# Patient Record
Sex: Female | Born: 1950 | Race: White | Hispanic: No | Marital: Married | Smoking: Former smoker
Health system: Southern US, Community
[De-identification: ages and names within clinical notes are randomized; demographics above are authoritative.]

---

## 2017-01-05 ENCOUNTER — Other Ambulatory Visit: Payer: Self-pay | Admitting: Physical Medicine and Rehabilitation

## 2017-01-05 DIAGNOSIS — M5416 Radiculopathy, lumbar region: Secondary | ICD-10-CM

## 2017-01-06 ENCOUNTER — Ambulatory Visit
Admission: RE | Admit: 2017-01-06 | Discharge: 2017-01-06 | Disposition: A | Discharge status: Home / Self Care | Payer: Medicare Other | Source: Ambulatory Visit | Attending: Physical Medicine and Rehabilitation | Admitting: Physical Medicine and Rehabilitation

## 2017-01-06 DIAGNOSIS — M5416 Radiculopathy, lumbar region: Secondary | ICD-10-CM

## 2019-02-23 ENCOUNTER — Ambulatory Visit

## 2019-03-01 ENCOUNTER — Ambulatory Visit: Payer: Medicare Other

## 2019-03-06 ENCOUNTER — Ambulatory Visit

## 2020-08-03 ENCOUNTER — Other Ambulatory Visit: Payer: Self-pay | Admitting: Physical Medicine and Rehabilitation

## 2020-08-03 DIAGNOSIS — R200 Anesthesia of skin: Secondary | ICD-10-CM

## 2020-08-03 DIAGNOSIS — M545 Low back pain, unspecified: Secondary | ICD-10-CM

## 2020-08-15 ENCOUNTER — Other Ambulatory Visit: Payer: Self-pay

## 2020-08-15 ENCOUNTER — Ambulatory Visit
Admission: RE | Admit: 2020-08-15 | Discharge: 2020-08-15 | Disposition: A | Discharge status: Home / Self Care | Source: Ambulatory Visit | Attending: Physical Medicine and Rehabilitation | Admitting: Physical Medicine and Rehabilitation

## 2020-08-15 DIAGNOSIS — M545 Low back pain, unspecified: Secondary | ICD-10-CM

## 2020-08-15 DIAGNOSIS — R200 Anesthesia of skin: Secondary | ICD-10-CM

## 2020-08-15 MED ORDER — GADOBENATE DIMEGLUMINE 529 MG/ML IV SOLN
10.0000 mL | Freq: Once | INTRAVENOUS | Status: AC | PRN
  Administered 2020-08-15: 10 mL via INTRAVENOUS

## 2022-06-07 ENCOUNTER — Ambulatory Visit: Payer: Medicare PPO | Admitting: Physical Therapy

## 2022-06-13 ENCOUNTER — Ambulatory Visit: Payer: Medicare PPO

## 2022-06-15 ENCOUNTER — Encounter: Admitting: Physical Therapy

## 2022-06-20 ENCOUNTER — Encounter: Admitting: Physical Therapy

## 2022-06-22 ENCOUNTER — Encounter: Admitting: Physical Therapy

## 2022-06-26 ENCOUNTER — Encounter

## 2022-06-28 ENCOUNTER — Encounter: Admitting: Physical Therapy

## 2022-07-03 ENCOUNTER — Encounter

## 2022-07-05 ENCOUNTER — Encounter: Admitting: Physical Therapy

## 2022-07-11 ENCOUNTER — Ambulatory Visit: Payer: Medicare PPO

## 2022-07-13 ENCOUNTER — Ambulatory Visit: Payer: Medicare PPO | Attending: Physical Medicine and Rehabilitation | Admitting: Physical Therapy

## 2022-07-13 ENCOUNTER — Encounter: Payer: Self-pay | Admitting: Physical Therapy

## 2022-07-13 DIAGNOSIS — M5459 Other low back pain: Secondary | ICD-10-CM | POA: Diagnosis present

## 2022-07-13 DIAGNOSIS — M6281 Muscle weakness (generalized): Secondary | ICD-10-CM | POA: Insufficient documentation

## 2022-07-13 NOTE — Therapy (Signed)
OUTPATIENT PHYSICAL THERAPY THORACOLUMBAR EVALUATION   Patient Name: Lindsey Waters MRN: [id number] DOB:21-Jul-1950, 72 y.o., female Today's Date: 07/13/2022  END OF SESSION:  PT End of Session - 07/13/22 1728     Visit Number 1    Number of Visits 12    Date for PT Re-Evaluation 08/24/22    Authorization Type [employer] M[employer] + [facility]    Progress Note Due on Visit 10    PT Start Time 1620    PT Stop Time 1700    PT Time Calculation (min) 40 min    Activity Tolerance Patient tolerated treatment well    Behavior During Therapy [facility] for tasks assessed/performed             History reviewed. No pertinent past medical history. History reviewed. No pertinent surgical history. There are no problems to display for this patient.   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   Rationale for Evaluation and Treatment: Rehabilitation  THERAPY DIAG:  Other low back pain  Muscle weakness (generalized)  ONSET DATE: chronic pain since 2018  SUBJECTIVE:                                                                                                                                                                                           SUBJECTIVE STATEMENT: I had a ruptured disk in 2018 it was really bad, went to [facility], it was really bad for several months before I had surgery, the surgery was successful, but with my age and arthritis, I've been getting epidurals in my spine for last 3 years which help for a while, have talked about deadening nerves, but I should have gotten into PT a long time ago, but was unable due to care giving for husband.  Pain medication helps but just get used to pain being there.  Pain is always there from the time I put my feet on the floor to the end of the day.   PERTINENT HISTORY:  History chronic LBP and  previous back surgery (lumbar diskectomy L5-S1 2019), Hepatitis C (cured), osteoporosis, L shoulder RTR, L chronic L shoulder pain.   PAIN:  Are you having pain? Yes: NPRS scale: 6/10 Pain location: across low back radiating down both posterior thighs Pain description: constant dull ache Aggravating factors: increases with activity, standing for long periods with cooking Relieving factors: ice, laying down and resting  PRECAUTIONS: None  WEIGHT BEARING RESTRICTIONS: No  FALLS:  Has patient fallen in last 6 months?  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 At L5-S1, significantly decreased size of the previously seen right subarticular disc protrusion. Small amount of residual disc in the right subarticular recess which contacts the descending right S1 nerve roots without clear impingement. Mild bilateral foraminal stenosis, slightly progressed. 2. At L4-L5, progression of now mild canal and bilateral subarticular recess stenosis with similar mild to moderate right and mild left foraminal stenosis. 3. At L3-L4, similar borderline bilateral subarticular recess stenosis and mild bilateral foraminal stenosis. 4. Sludge with 2 cm stone in the gallbladder with similar findings on the prior. The common bile duct is mildly dilated (approximately 7 mm) which may be in part chronic given some dilation on the  prior. There is suggestion of possible filling defect within the distal common bile duct (series 5, image 17), although evaluation is limited on this study. Correlation with liver enzymes may be helpful. If there is clinical concern for obstruction then MRCP could further evaluate.  PATIENT SURVEYS:  Modified Oswestry 18/50= 36%   SCREENING FOR RED FLAGS: Bowel or bladder incontinence: No Spinal tumors: No Cauda equina syndrome: No Compression fracture: No Abdominal aneurysm: No  COGNITION: Overall cognitive status: Within functional limits for tasks assessed     SENSATION: WFL Tingling in bil anterior thighs  MUSCLE LENGTH: Hamstrings: Right ~75 deg moderate tightness; Left 90 deg Quadriceps - moderate tightness bil  POSTURE: decreased lumbar lordosis  PALPATION: Diffuse tenderness throughout bil lumbar paraspinals, lumbar spine with PA mobs, bil SIJ, bil QL, bil glute med.  Noted muscle atrophy in R piriformis.   LUMBAR ROM:   AROM eval  Flexion To ankles, mod pain  Extension WNL, dec pain  Right lateral flexion To knee  Left lateral flexion To knee, inc pain  Right rotation WNL, inc pain  Left rotation WNL, inc pain   (Blank rows = not tested)  LOWER EXTREMITY ROM:    L hip tight with ER, good IR, pinch with FABER, 90 SLR, min HS tightness, on R HS tighter, hip scour pos, Faber neg (felt like good stretch), hip ROM = to L.    LOWER EXTREMITY MMT:    MMT Right eval Left eval  Hip flexion 4+ 4+  Hip extension 3+ 4  Hip abduction 5 5  Hip adduction 4+ 4+  Knee flexion 5 5  Knee extension 5 5  Ankle dorsiflexion 5 5   (Blank rows = not tested)  LUMBAR SPECIAL TESTS:  Straight leg raise test: Negative and FABER test: reports pinching in back on L.  Positive scour test on R.   GAIT: Comments: WNL, no deviation or device  TODAY'S TREATMENT:  DATE:   Therapeutic Exercise: to improve strength and mobility.  Demo, verbal and tactile cues throughout for technique. Prone knee bends x 10 - hamstring cramping on L Hamstring stretches - seated - cues needed for form   PATIENT EDUCATION:  Education details: findings, POC, initial HEP Person educated: Patient Education method: Explanation, Demonstration, Verbal cues, and Handouts Education comprehension: verbalized understanding and returned demonstration  HOME EXERCISE PROGRAM: Access Code: [email or web address] URL: [email or web address] Date: 07/13/2022 Prepared by: Harrie Foreman  Exercises - Prone Knee Flexion  - 1 x daily - 7 x weekly - 2 sets - 10 reps - Seated Hamstring Stretch  - 1 x daily - 7 x weekly - 1 sets - 3 reps - 30 sec to 1 min  hold - Supine Hamstring Stretch with Strap  - 1 x daily - 7 x weekly - 1 sets - 3 reps - 30 sec to 1 min  hold  ASSESSMENT:  CLINICAL IMPRESSION: Patient is a 72 y.o. female who was seen today for physical therapy evaluation and treatment for chronic low back pain.  She reports history of back pain after disc herniation and surgery in 2018, and feeling that muscles in back are not strong enough to support her.  She has a preference for lumbar extension.  Noted today diffuse tenderness throughout lumbar region, weakness in lumbar and hip extensors, tightness in R hamstring, and increased neural tension with prone knee bend.  Diannia Ruder would benefit from skilled physical therapy to decrease low back pain, improve core and hip strength, and improve tolerance to activities to be able to perform ADLS with less discomfort and improve quality of life.     OBJECTIVE IMPAIRMENTS: decreased activity tolerance, decreased ROM, decreased strength, increased muscle spasms, impaired sensation, and pain.   ACTIVITY LIMITATIONS: carrying, lifting, bending, standing, squatting, sleeping, dressing, locomotion level, and caring for  others  PARTICIPATION LIMITATIONS: meal prep, cleaning, laundry, driving, shopping, community activity, and occupation  PERSONAL FACTORS: Age, Time since onset of injury/illness/exacerbation, and 1-2 comorbidities: History chronic LBP and previous back surgery (lumbar diskectomy L5-S1 2019),  osteoporosis, L shoulder RTR, L chronic L shoulder pain   are also affecting patient's functional outcome.   REHAB POTENTIAL: Good  CLINICAL DECISION MAKING: Evolving/moderate complexity  EVALUATION COMPLEXITY: Moderate  GOALS: Goals reviewed with patient? Yes  SHORT TERM GOALS: Target date: 07/27/2022   Patient will be independent with initial HEP.  Baseline:  Goal status: INITIAL  LONG TERM GOALS: Target date: 08/24/2022    Patient will be independent with advanced/ongoing HEP to improve outcomes and carryover.  Baseline:  Goal status: INITIAL  2.  Patient will report 50% improvement in low back pain to improve QOL.  Baseline:  Goal status: INITIAL  3.  Patient will demonstrate improved functional strength as demonstrated by 5/5 hip strength. Baseline: see objective Goal status: INITIAL  4.  Patient will report at least 6 points improvement on modified Oswestry to demonstrate improved functional ability.  Baseline: 18/50 Goal status: INITIAL   5.  Patient will tolerate standing for an hour without increased low back pain in order to cook.   Baseline: has to stop, lay down, ice back Goal status: INITIAL  PLAN:  PT FREQUENCY: 1-2x/week  PT DURATION: 6 weeks  PLANNED INTERVENTIONS: Therapeutic exercises, Therapeutic activity, Neuromuscular re-education, Balance training, Gait training, Patient/Family education, Self Care, Joint mobilization, Joint manipulation, Dry Needling, Electrical stimulation, Spinal manipulation, Spinal mobilization, Cryotherapy, Moist heat, Taping, Traction, Ultrasound, Manual therapy, and Re-evaluation.  PLAN  FOR NEXT SESSION: progress core/hip extensor  strengthening, discuss TrDN to multifidi with Estim    Jena Gauss, PT, DPT 07/13/2022, 5:51 PM

## 2022-07-17 ENCOUNTER — Ambulatory Visit: Payer: Medicare PPO | Admitting: Physical Therapy

## 2022-07-18 ENCOUNTER — Ambulatory Visit: Payer: Medicare PPO

## 2022-07-20 ENCOUNTER — Encounter

## 2022-07-25 ENCOUNTER — Encounter

## 2022-08-01 ENCOUNTER — Encounter

## 2022-08-03 ENCOUNTER — Ambulatory Visit: Payer: Medicare PPO

## 2022-08-08 ENCOUNTER — Encounter

## 2022-08-10 ENCOUNTER — Ambulatory Visit: Payer: Medicare PPO | Attending: Physical Medicine and Rehabilitation

## 2022-08-10 DIAGNOSIS — M6281 Muscle weakness (generalized): Secondary | ICD-10-CM | POA: Diagnosis present

## 2022-08-10 DIAGNOSIS — M5459 Other low back pain: Secondary | ICD-10-CM | POA: Insufficient documentation

## 2022-08-10 NOTE — Therapy (Signed)
OUTPATIENT PHYSICAL THERAPY THORACOLUMBAR EVALUATION   Patient Name: Lindsey Waters MRN: [id number] DOB:1950/05/23, 72 y.o., female Today's Date: 08/10/2022  END OF SESSION:  PT End of Session - 08/10/22 1431     Visit Number 2    Number of Visits 12    Date for PT Re-Evaluation 08/24/22    Authorization Type [employer] [facility]    Progress Note Due on Visit 10    PT Start Time 1404    PT Stop Time 1448    PT Time Calculation (min) 44 min    Activity Tolerance Patient tolerated treatment well    Behavior During Therapy WFL for tasks assessed/performed              History reviewed. No pertinent past medical history. History reviewed. No pertinent surgical history. There are no problems to display for this patient.   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   PERTINENT HISTORY:  History chronic LBP and previous back surgery (lumbar diskectomy L5-S1 2019), Hepatitis C (cured), osteoporosis, L shoulder RTR, L chronic L shoulder pain.   PAIN:  Are you having pain? Yes: NPRS scale: 6/10 Pain location: across low back radiating down both posterior thighs Pain description: constant dull ache Aggravating factors: increases with activity, standing for long periods with cooking Relieving factors: ice, laying down and resting  PRECAUTIONS: None  WEIGHT BEARING  RESTRICTIONS: No  FALLS:  Has patient fallen in last 6 months? 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 At L5-S1, significantly decreased size of the previously seen right subarticular disc protrusion. Small amount of residual disc in the right subarticular recess which contacts the descending right S1 nerve roots without clear impingement. Mild bilateral foraminal stenosis, slightly progressed. 2. At L4-L5, progression of now mild canal and bilateral subarticular recess stenosis with similar mild to moderate right and mild left foraminal stenosis. 3. At L3-L4, similar borderline bilateral subarticular recess stenosis and mild bilateral foraminal stenosis. 4. Sludge with 2 cm stone in the gallbladder with similar findings on the prior. The common bile duct is mildly dilated (approximately 7 mm) which may be in part chronic given some dilation on the prior. There is suggestion of possible filling defect within the distal common bile duct (series 5, image 17), although evaluation is limited on this study. Correlation with liver enzymes may be helpful. If there is clinical concern for obstruction then MRCP could further evaluate.  PATIENT SURVEYS:  Modified Oswestry 18/50= 36%   SCREENING FOR RED FLAGS: Bowel or bladder incontinence: No Spinal tumors: No Cauda equina syndrome: No Compression  fracture: No Abdominal aneurysm: No  COGNITION: Overall cognitive status: Within functional limits for tasks assessed     SENSATION: WFL Tingling in bil anterior thighs  MUSCLE LENGTH: Hamstrings: Right ~75 deg moderate tightness; Left 90 deg Quadriceps - moderate tightness bil  POSTURE: decreased lumbar lordosis  PALPATION: Diffuse tenderness throughout bil lumbar paraspinals, lumbar spine with PA mobs, bil SIJ, bil QL, bil glute med.  Noted muscle atrophy in R piriformis.   LUMBAR ROM:   AROM eval  Flexion To ankles, mod pain  Extension WNL, dec pain  Right lateral flexion To knee  Left lateral flexion To knee, inc pain  Right rotation WNL, inc pain  Left rotation WNL, inc pain   (Blank rows = not tested)  LOWER EXTREMITY ROM:    L hip tight with ER, good IR, pinch with FABER, 90 SLR, min HS tightness, on R HS tighter, hip scour pos, Faber neg (felt like good stretch), hip ROM = to L.    LOWER EXTREMITY MMT:    MMT Right eval Left eval  Hip flexion 4+ 4+  Hip extension 3+ 4  Hip abduction 5 5  Hip adduction 4+ 4+  Knee flexion 5 5  Knee extension 5 5  Ankle dorsiflexion 5 5   (Blank rows = not tested)  LUMBAR SPECIAL TESTS:  Straight leg raise test: Negative and FABER test: reports pinching in back on L.  Positive scour test on R.   GAIT: Comments: WNL, no deviation or device  TODAY'S TREATMENT:                                                                                                                              DATE:  08/10/22 Supine PPT 2x10 Supine heel slide with TrA x 10  Supine heel press into table  leg extended x 10  Supine SKTC stretch 3x10" Supine LTR x 10 bil Seated hamstring stretch 3x10" Seated LAQ x 5 bil  Therapeutic Exercise: to improve strength and mobility.  Demo, verbal and tactile cues throughout for technique. Prone knee bends x 10 - hamstring cramping on L Hamstring stretches - seated - cues needed for form   PATIENT  EDUCATION:  Education details: HEP update Person educated: Patient Education method: Explanation, Demonstration, Verbal cues, and Handouts Education comprehension: verbalized understanding and returned demonstration  HOME EXERCISE PROGRAM: Access Code: [email or web address] URL: [email or web address] Date: 08/10/2022 Prepared by: Verta Ellen  Exercises - Seated Hamstring Stretch  - 1 x daily - 7 x weekly - 1 sets - 3 reps - 30 sec to 1 min  hold - Supine Hamstring Stretch with Strap  - 1 x daily - 7 x weekly - 1 sets - 3 reps - 30 sec to 1 min  hold - Supine Posterior Pelvic Tilt  - 1 x daily - 7 x weekly - 3 sets - 10 reps - Supine Single Knee to Chest Stretch  - 1 x daily - 7 x weekly - 3 sets - 3 reps - 10 sec hold - Supine Transversus Abdominis Bracing with Heel Slide  - 1 x daily - 7 x weekly - 2 sets - 10 reps  ASSESSMENT:  CLINICAL IMPRESSION: Able to progress exercises to tolerance. Did very light exercises to address pain and improve lumbo-pelvic mobility. Did need cues to avoid over engaging muscles with pelvic tilts and heel presses. Pt has missed therapy for a few weeks, noting increased pain from initial eval, no improvements yet d/t not returning since eval. Diannia Ruder would benefit from skilled physical therapy to decrease low back pain, improve core and hip strength, and improve tolerance to activities to be able to perform ADLS with less discomfort and improve quality of life.     OBJECTIVE IMPAIRMENTS: decreased activity tolerance, decreased ROM, decreased strength, increased muscle spasms, impaired sensation, and pain.   ACTIVITY LIMITATIONS: carrying, lifting, bending, standing, squatting, sleeping, dressing, locomotion level, and caring for others  PARTICIPATION LIMITATIONS: meal prep, cleaning, laundry, driving, shopping, community activity, and occupation  PERSONAL FACTORS: Age, Time since onset of injury/illness/exacerbation, and 1-2 comorbidities: History  chronic LBP and previous back surgery (lumbar diskectomy L5-S1 2019),  osteoporosis, L shoulder RTR, L chronic L shoulder pain   are also affecting patient's functional outcome.   REHAB POTENTIAL: Good  CLINICAL DECISION MAKING: Evolving/moderate complexity  EVALUATION COMPLEXITY: Moderate  GOALS: Goals reviewed with patient? Yes  SHORT TERM GOALS: Target date: 07/27/2022   Patient will be independent with initial HEP.  Baseline:  Goal status: INITIAL  LONG TERM GOALS: Target date: 08/24/2022    Patient will be independent with advanced/ongoing HEP to improve outcomes and carryover.  Baseline:  Goal status: INITIAL  2.  Patient will report 50% improvement in low back pain to improve QOL.  Baseline:  Goal status: IN PROGRESS-   3.  Patient will demonstrate improved functional strength as demonstrated by 5/5 hip strength. Baseline: see objective Goal status: INITIAL  4.  Patient will report at least 6 points improvement on modified Oswestry to demonstrate improved functional ability.  Baseline: 18/50 Goal status: INITIAL   5.  Patient will tolerate standing for an hour without increased low back pain in order to cook.   Baseline: has to stop, lay down, ice back Goal status: INITIAL  PLAN:  PT FREQUENCY: 1-2x/week  PT DURATION: 6  weeks  PLANNED INTERVENTIONS: Therapeutic exercises, Therapeutic activity, Neuromuscular re-education, Balance training, Gait training, Patient/Family education, Self Care, Joint mobilization, Joint manipulation, Dry Needling, Electrical stimulation, Spinal manipulation, Spinal mobilization, Cryotherapy, Moist heat, Taping, Traction, Ultrasound, Manual therapy, and Re-evaluation.  PLAN FOR NEXT SESSION: gentle progression of core/hip extensor strengthening, discuss TrDN to multifidi with Estim    Darleene Cleaver, PTA 08/10/2022, 4:04 PM

## 2022-08-15 ENCOUNTER — Ambulatory Visit: Payer: Medicare PPO

## 2022-08-15 ENCOUNTER — Other Ambulatory Visit: Payer: Self-pay

## 2022-08-15 DIAGNOSIS — M5459 Other low back pain: Secondary | ICD-10-CM

## 2022-08-15 DIAGNOSIS — M6281 Muscle weakness (generalized): Secondary | ICD-10-CM

## 2022-08-15 NOTE — Therapy (Signed)
OUTPATIENT PHYSICAL THERAPY THORACOLUMBAR TREATMENT   Patient Name: Lindsey Waters MRN: [id number] DOB:1951-01-07, 72 y.o., female Today's Date: 08/15/2022  END OF SESSION:  PT End of Session - 08/15/22 1412     Visit Number 3    Number of Visits 12    Date for PT Re-Evaluation 08/24/22    Authorization Type [employer] MCR + [facility]    Progress Note Due on Visit 10    PT Start Time 1402    PT Stop Time 1445    PT Time Calculation (min) 43 min    Activity Tolerance Patient tolerated treatment well    Behavior During Therapy WFL for tasks assessed/performed               History reviewed. No pertinent past medical history. History reviewed. No pertinent surgical history. There are no problems to display for this patient.   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   PERTINENT HISTORY:  History chronic LBP and previous back surgery (lumbar diskectomy L5-S1 2019), Hepatitis C (cured), osteoporosis, L shoulder RTR, L chronic L shoulder pain.   PAIN:  Are you having pain? Yes: NPRS scale: 6/10 Pain location: across low back radiating down both posterior thighs Pain description: constant dull ache Aggravating factors: increases with activity, standing for long periods with cooking Relieving factors: ice, laying down and resting  PRECAUTIONS: None  WEIGHT  BEARING RESTRICTIONS: No  FALLS:  Has patient fallen in last 6 months? 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 At L5-S1, significantly decreased size of the previously seen right subarticular disc protrusion. Small amount of residual disc in the right subarticular recess which contacts the descending right S1 nerve roots without clear impingement. Mild bilateral foraminal stenosis, slightly progressed. 2. At L4-L5, progression of now mild canal and bilateral subarticular recess stenosis with similar mild to moderate right and mild left foraminal stenosis. 3. At L3-L4, similar borderline bilateral subarticular recess stenosis and mild bilateral foraminal stenosis. 4. Sludge with 2 cm stone in the gallbladder with similar findings on the prior. The common bile duct is mildly dilated (approximately 7 mm) which may be in part chronic given some dilation on the prior. There is suggestion of possible filling defect within the distal common bile duct (series 5, image 17), although evaluation is limited on this study. Correlation with liver enzymes may be helpful. If there is clinical concern for obstruction then MRCP could further evaluate.  PATIENT SURVEYS:  Modified Oswestry 18/50= 36%   SCREENING FOR RED FLAGS: Bowel or bladder incontinence: No Spinal tumors: No Cauda equina syndrome:  No Compression fracture: No Abdominal aneurysm: No  COGNITION: Overall cognitive status: Within functional limits for tasks assessed     SENSATION: WFL Tingling in bil anterior thighs  MUSCLE LENGTH: Hamstrings: Right ~75 deg moderate tightness; Left 90 deg Quadriceps - moderate tightness bil  POSTURE: decreased lumbar lordosis  PALPATION: Diffuse tenderness throughout bil lumbar paraspinals, lumbar spine with PA mobs, bil SIJ, bil QL, bil glute med.  Noted muscle atrophy in R piriformis.   LUMBAR ROM:   AROM eval  Flexion To ankles, mod pain  Extension WNL, dec pain  Right lateral flexion To knee  Left lateral flexion To knee, inc pain  Right rotation WNL, inc pain  Left rotation WNL, inc pain   (Blank rows = not tested)  LOWER EXTREMITY ROM:    L hip tight with ER, good IR, pinch with FABER, 90 SLR, min HS tightness, on R HS tighter, hip scour pos, Faber neg (felt like good stretch), hip ROM = to L.    LOWER EXTREMITY MMT:    MMT Right eval Left eval  Hip flexion 4+ 4+  Hip extension 3+ 4  Hip abduction 5 5  Hip adduction 4+ 4+  Knee flexion 5 5  Knee extension 5 5  Ankle dorsiflexion 5 5   (Blank rows = not tested)  LUMBAR SPECIAL TESTS:  Straight leg raise test: Negative and FABER test: reports pinching in back on L.  Positive scour test on R.   GAIT: Comments: WNL, no deviation or device  TODAY'S TREATMENT:                                                                                                                              DATE:  08/15/22:Therapeutic exercises:  instructed/ guided the patient in therex as described below with emphasis on  improving upright posture through thoracic spine and ant hips , educated pt throughout on pacing activities and positioning, posture to reduce compressive forces LS spine. All exercises performed to tolerance, 10 to 20 reps each  Supine , one leg over peanut physioball, therapist assisted with very light manual  stretching hip adductors and flexors each leg to tolerance Supine with B legs over peanut physioball for bridging, avoiding excessive torque lumbar region and isolating B gluteals Supine B legs over peanut for isometric B shoulder horizontal abduction combined with B shoulder flexion to engage, recruit scapular retractors Standing B shoulder flexion, wall slides to improve, decompress thoracic spine     08/10/22 Supine PPT 2x10 Supine heel slide with TrA x 10  Supine heel press into table leg extended x 10  Supine SKTC stretch 3x10" Supine LTR x 10 bil Seated hamstring stretch 3x10" Seated LAQ x 5 bil  Therapeutic Exercise: to improve strength and mobility.  Demo, verbal and tactile cues throughout for technique. Prone knee bends x 10 - hamstring cramping on L Hamstring stretches - seated - cues needed for form   PATIENT EDUCATION:  Education details: HEP update Person educated: Patient Education method: Explanation, Demonstration, Verbal cues, and Handouts Education comprehension: verbalized understanding and returned demonstration  HOME EXERCISE PROGRAM: Access Code: [facility] [place] URL: [email or web address] Date: 08/15/2022 Prepared by: Caralee Ates  Exercises - Standing shoulder flexion wall slides  - 1 x daily - 7 x weekly - 3 sets - 10 reps - Supine Bridge with Pelvic Floor Contraction on Swiss Ball  - 1 x daily - 7 x weekly - 3 sets - 10 reps - Supine Shoulder Overhead Flexion with Ball  - 1 x daily - 7 x weekly - 3 sets - 10 reps Access Code: [email or web address] URL: [email or web address] Date: 08/10/2022 Prepared by: Verta Ellen  Exercises - Seated Hamstring Stretch  - 1 x daily - 7 x weekly - 1 sets - 3 reps - 30 sec to 1 min  hold - Supine Hamstring Stretch with Strap  - 1 x daily - 7 x weekly - 1 sets - 3 reps - 30 sec to 1 min  hold - Supine Posterior Pelvic Tilt  - 1 x daily - 7 x weekly - 3 sets - 10 reps - Supine Single Knee to Chest Stretch  -  1 x daily - 7 x weekly - 3 sets - 3 reps - 10 sec hold - Supine Transversus Abdominis Bracing with Heel Slide  - 1 x daily - 7 x weekly - 2 sets - 10 reps  ASSESSMENT:  CLINICAL IMPRESSION: Able to progress exercises to tolerance. Very stiff, labored movements with sit to stand and with bed mobility.  Has protective posture, with flexed B hips and l lateral pelvic shift noted.   Diannia Ruder would benefit from skilled physical therapy to decrease low back pain, improve core and hip strength, and improve tolerance to activities to be able to perform ADLS with less discomfort and improve quality of life.     OBJECTIVE IMPAIRMENTS: decreased activity tolerance, decreased ROM, decreased strength, increased muscle spasms, impaired sensation, and pain.   ACTIVITY LIMITATIONS: carrying, lifting, bending, standing, squatting, sleeping, dressing, locomotion level, and caring for others  PARTICIPATION LIMITATIONS: meal prep, cleaning, laundry, driving, shopping, community activity, and occupation  PERSONAL FACTORS: Age, Time since onset of injury/illness/exacerbation, and 1-2 comorbidities: History chronic LBP and previous back surgery (lumbar diskectomy L5-S1 2019),  osteoporosis, L shoulder RTR, L chronic L shoulder pain  are also affecting patient's functional outcome.   REHAB POTENTIAL: Good  CLINICAL DECISION MAKING: Evolving/moderate complexity  EVALUATION COMPLEXITY: Moderate  GOALS: Goals reviewed with patient? Yes  SHORT TERM GOALS: Target date: 07/27/2022   Patient will be independent with initial HEP.  Baseline:  Goal status: INITIAL  LONG TERM GOALS: Target date: 08/24/2022    Patient will be independent with advanced/ongoing HEP to improve outcomes and carryover.  Baseline:  Goal status: INITIAL  2.  Patient will report 50% improvement in low back pain to improve QOL.  Baseline:  Goal status: IN PROGRESS-   3.  Patient will demonstrate improved functional strength as  demonstrated by 5/5 hip strength. Baseline: see objective Goal status: INITIAL  4.  Patient will report at least 6 points improvement on modified Oswestry to demonstrate improved functional ability.  Baseline: 18/50 Goal status: INITIAL   5.  Patient will tolerate standing for an hour without increased low back pain in order to cook.   Baseline: has to stop, lay down, ice back Goal status: INITIAL  PLAN:  PT FREQUENCY: 1-2x/week  PT DURATION: 6 weeks  PLANNED INTERVENTIONS: Therapeutic exercises, Therapeutic activity, Neuromuscular re-education, Balance training, Gait training, Patient/Family education, Self Care, Joint mobilization, Joint manipulation, Dry Needling, Electrical stimulation, Spinal manipulation, Spinal mobilization, Cryotherapy, Moist heat, Taping, Traction, Ultrasound, Manual therapy, and Re-evaluation.  PLAN FOR NEXT SESSION: gentle progression of core/hip extensor strengthening, discuss TrDN to multifidi with Estim    Orvin Netter L Mataya Kilduff, PT. DPT OCS 08/15/2022, 5:33 PM

## 2022-09-07 ENCOUNTER — Encounter: Admitting: Physical Therapy

## 2022-09-13 ENCOUNTER — Encounter: Payer: Self-pay | Admitting: Physical Therapy

## 2022-09-13 ENCOUNTER — Ambulatory Visit: Payer: Medicare PPO | Attending: Physical Medicine and Rehabilitation | Admitting: Physical Therapy

## 2022-09-13 DIAGNOSIS — M6281 Muscle weakness (generalized): Secondary | ICD-10-CM | POA: Insufficient documentation

## 2022-09-13 DIAGNOSIS — M5459 Other low back pain: Secondary | ICD-10-CM | POA: Diagnosis present

## 2022-09-13 NOTE — Therapy (Signed)
OUTPATIENT PHYSICAL THERAPY THORACOLUMBAR TREATMENT/RECERT  Progress Note Reporting Period 07/13/22 to 09/13/2022  See note below for Objective Data and Assessment of Progress/Goals.      Patient Name: Lindsey Waters MRN: [id number] DOB:[birth year], 72 y.o., female Today's Date: 09/13/2022  END OF SESSION:  PT End of Session - 09/13/22 1321     Visit Number 4    Number of Visits 12    Date for PT Re-Evaluation 10/11/22    Authorization Type [employer] + [facility]    Progress Note Due on Visit 10    PT Start Time 1315    PT Stop Time 1401    PT Time Calculation (min) 46 min    Activity Tolerance Patient tolerated treatment well    Behavior During Therapy WFL for tasks assessed/performed               History reviewed. No pertinent past medical history. History reviewed. No pertinent surgical history. There are no problems to display for this patient.   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  Said MD is considering nerve ablation, but has liver stone, and other gallstones, so needs to get that procedure done first.     PERTINENT HISTORY:  History chronic LBP and previous back surgery (lumbar diskectomy L5-S1 2019), Hepatitis C (cured), osteoporosis, L shoulder RTR, L chronic L shoulder pain.   PAIN:  Are you having pain? Yes: NPRS scale:  6/10 Pain location: across low back radiating down both posterior thighs Pain description: constant dull ache Aggravating factors: increases with activity, standing for long periods with cooking Relieving factors: ice, laying down and resting  PRECAUTIONS: None  WEIGHT BEARING RESTRICTIONS: No  FALLS:  Has patient fallen in last 6 months? 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 At L5-S1, significantly decreased size of the previously seen right subarticular disc protrusion. Small amount of residual disc in the right subarticular recess which contacts the descending right S1 nerve roots without clear impingement. Mild bilateral foraminal stenosis, slightly progressed. 2. At L4-L5, progression of now mild canal and bilateral subarticular recess stenosis with similar mild to moderate right and mild left foraminal stenosis. 3. At L3-L4, similar borderline bilateral subarticular recess stenosis and mild bilateral foraminal stenosis. 4. Sludge with 2 cm stone in the gallbladder with similar findings on the prior. The common bile duct is mildly dilated (approximately 7 mm) which may be in part chronic given some dilation on the prior. There is suggestion of possible filling defect within the distal common bile duct (series 5, image 17), although evaluation is limited on this study. Correlation with  liver enzymes may be helpful. If there is clinical concern for obstruction then MRCP could further evaluate.  PATIENT SURVEYS:  Modified Oswestry 18/50= 36%   SCREENING FOR RED FLAGS: Bowel or bladder incontinence: No Spinal tumors: No Cauda equina syndrome: No Compression fracture: No Abdominal aneurysm: No  COGNITION: Overall cognitive status: Within functional limits for tasks assessed     SENSATION: WFL Tingling in bil anterior thighs  MUSCLE LENGTH: Hamstrings: Right ~75 deg moderate tightness; Left 90 deg Quadriceps - moderate tightness bil  POSTURE: decreased lumbar lordosis  PALPATION: Diffuse tenderness throughout bil lumbar paraspinals, lumbar spine with PA mobs, bil SIJ, bil QL, bil glute med.  Noted muscle atrophy in R piriformis.   LUMBAR ROM:   AROM eval  Flexion To ankles, mod pain  Extension WNL, dec pain  Right lateral flexion To knee  Left lateral flexion To knee, inc pain  Right rotation WNL, inc pain  Left rotation WNL, inc pain   (Blank rows = not tested)  LOWER EXTREMITY ROM:    L hip tight with ER, good IR, pinch with FABER, 90 SLR, min HS tightness, on R HS tighter, hip scour pos, Faber neg (felt like good stretch), hip ROM = to L.    LOWER EXTREMITY MMT:    MMT Right eval Left eval  Hip flexion 4+ 4+  Hip extension 3+ 4  Hip abduction 5 5  Hip adduction 4+ 4+  Knee flexion 5 5  Knee extension 5 5  Ankle dorsiflexion 5 5   (Blank rows = not tested)  LUMBAR SPECIAL TESTS:  Straight leg raise test: Negative and FABER test: reports pinching in back on L.  Positive scour test on R.   GAIT: Comments: WNL, no deviation or device  TODAY'S TREATMENT:  DATE:   09/13/2022 Therapeutic Exercise: to improve strength and mobility.  Demo, verbal and tactile cues throughout for technique. Osteoporosis Exercises a la  [place]: Spinal Decompression with diaphragmatic breathing Shoulder press Head Press Leg lengthener Leg Press Manual Therapy: to decrease muscle spasm and pain and improve mobility STM/TPR to lumbar paraspinals, QL and glutes in S/L.   08/15/22:Therapeutic exercises:  instructed/ guided the patient in therex as described below with emphasis on improving upright posture through thoracic spine and ant hips , educated pt throughout on pacing activities and positioning, posture to reduce compressive forces LS spine. All exercises performed to tolerance, 10 to 20 reps each  Supine , one leg over peanut physioball, therapist assisted with very light manual stretching hip adductors and flexors each leg to tolerance Supine with B legs over peanut physioball for bridging, avoiding excessive torque lumbar region and isolating B gluteals Supine B legs over peanut for isometric B shoulder horizontal abduction combined with B shoulder flexion to engage, recruit scapular retractors Standing B shoulder flexion, wall slides to improve, decompress thoracic spine  08/10/22 Supine PPT 2x10 Supine heel slide with TrA x 10  Supine heel press into table leg extended x 10  Supine SKTC stretch 3x10" Supine LTR x 10 bil Seated hamstring stretch 3x10" Seated LAQ x 5 bil  Therapeutic Exercise: to improve strength and mobility.  Demo, verbal and tactile cues throughout for technique. Prone knee bends x 10 - hamstring cramping on L Hamstring stretches - seated - cues needed for form   PATIENT EDUCATION:  Education details: Handout with osteoporosis exercises Person educated: Patient Education method: Explanation, Demonstration, Verbal cues, and Handouts Education comprehension: verbalized understanding and returned demonstration  HOME EXERCISE PROGRAM: Access Code: [email or web address] URL: [email or web address] Date: 08/10/2022 Prepared by: Verta Ellen  Exercises - Seated Hamstring Stretch  - 1 x  daily - 7 x weekly - 1 sets - 3 reps - 30 sec to 1 min  hold - Supine Hamstring Stretch with Strap  - 1 x daily - 7 x weekly - 1 sets - 3 reps - 30 sec to 1 min  hold - Supine Posterior Pelvic Tilt  - 1 x daily - 7 x weekly - 3 sets - 10 reps - Supine Single Knee to Chest Stretch  - 1 x daily - 7 x weekly - 3 sets - 3 reps - 10 sec hold - Supine Transversus Abdominis Bracing with Heel Slide  - 1 x daily - 7 x weekly - 2 sets - 10 reps  ASSESSMENT:  CLINICAL IMPRESSION: Lindsey Waters returns after almost 1 month absence.  She was very unhappy after last session, feeling the other therapist was too aggressive.  Apologized for experience, and reassured she will be assigned to only this therapist and PTA for future appointments.  Since she has only come for a couple of visits, she has not made any progress, so extending POC for 6 more weeks.  She has been performing initial HEP given by PTA, meeting STG #1.  Today started with gentle exercises designed for osteoporosis a la [place], which she tolerated very well and reported did relieve pain with out aggravating her back, given handout with exercises to do to help relax her back.  Gentle manual therapy to back in sidelying to further relax back.  Lindsey Waters continues to demonstrate potential for improvement and would benefit from continued skilled therapy to address impairments.        OBJECTIVE IMPAIRMENTS: decreased activity tolerance, decreased  ROM, decreased strength, increased muscle spasms, impaired sensation, and pain.   ACTIVITY LIMITATIONS: carrying, lifting, bending, standing, squatting, sleeping, dressing, locomotion level, and caring for others  PARTICIPATION LIMITATIONS: meal prep, cleaning, laundry, driving, shopping, community activity, and occupation  PERSONAL FACTORS: Age, Time since onset of injury/illness/exacerbation, and 1-2 comorbidities: History chronic LBP and previous back surgery (lumbar diskectomy L5-S1 2019),   osteoporosis, L shoulder RTR, L chronic L shoulder pain   are also affecting patient's functional outcome.   REHAB POTENTIAL: Good  CLINICAL DECISION MAKING: Evolving/moderate complexity  EVALUATION COMPLEXITY: Moderate  GOALS: Goals reviewed with patient? Yes  SHORT TERM GOALS: Target date: 07/27/2022   Patient will be independent with initial HEP.  Baseline:  Goal status: MET  09/13/22  LONG TERM GOALS: Target date: 08/24/2022    Patient will be independent with advanced/ongoing HEP to improve outcomes and carryover.  Baseline:  Goal status: IN PROGRESS 09/13/22- updated with osteoporosis exercises  2.  Patient will report 50% improvement in low back pain to improve QOL.  Baseline:  Goal status: IN PROGRESS- 09/13/22- no change  3.  Patient will demonstrate improved functional strength as demonstrated by 5/5 hip strength. Baseline: see objective Goal status: IN PROGRESS  4.  Patient will report at least 6 points improvement on modified Oswestry to demonstrate improved functional ability.  Baseline: 18/50 Goal status: IN PROGRESS   5.  Patient will tolerate standing for an hour without increased low back pain in order to cook.   Baseline: has to stop, lay down, ice back Goal status: IN PROGRESS  PLAN:  PT FREQUENCY: 1-2x/week  PT DURATION: 6 weeks  PLANNED INTERVENTIONS: Therapeutic exercises, Therapeutic activity, Neuromuscular re-education, Balance training, Gait training, Patient/Family education, Self Care, Joint mobilization, Joint manipulation, Dry Needling, Electrical stimulation, Spinal manipulation, Spinal mobilization, Cryotherapy, Moist heat, Taping, Traction, Ultrasound, Manual therapy, and Re-evaluation.  PLAN FOR NEXT SESSION: gentle progression of core/hip extensor strengthening, discuss TrDN to multifidi with Estim    Jena Gauss, PT. DPT  09/13/2022, 3:14 PM

## 2022-09-27 ENCOUNTER — Ambulatory Visit: Payer: Medicare PPO | Admitting: Physical Therapy

## 2022-10-04 ENCOUNTER — Ambulatory Visit: Payer: Medicare PPO

## 2022-10-09 ENCOUNTER — Ambulatory Visit: Payer: Medicare PPO | Attending: Physical Medicine and Rehabilitation | Admitting: Physical Therapy

## 2022-10-09 ENCOUNTER — Encounter: Payer: Self-pay | Admitting: Physical Therapy

## 2022-10-09 DIAGNOSIS — M5459 Other low back pain: Secondary | ICD-10-CM | POA: Diagnosis present

## 2022-10-09 DIAGNOSIS — M6281 Muscle weakness (generalized): Secondary | ICD-10-CM | POA: Insufficient documentation

## 2022-10-09 NOTE — Therapy (Signed)
OUTPATIENT PHYSICAL THERAPY THORACOLUMBAR TREATMENT/RECERT   Patient Name: Lindsey Waters MRN: [id number] DOB:1951-01-01, 72 y.o., female Today's Date: 10/09/2022  END OF SESSION:  PT End of Session - 10/09/22 1453     Visit Number 5    Number of Visits 12    Date for PT Re-Evaluation 10/11/22    Authorization Type [employer] MCR + [facility]    Progress Note Due on Visit 10    PT Start Time 1446    PT Stop Time 1532    PT Time Calculation (min) 46 min    Activity Tolerance Patient tolerated treatment well    Behavior During Therapy WFL for tasks assessed/performed               History reviewed. No pertinent past medical history. History reviewed. No pertinent surgical history. There are no problems to display for this patient.   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    Appt. Oct 14 for bile ducts.   PERTINENT HISTORY:  History chronic LBP and previous back surgery (lumbar diskectomy L5-S1 2019), Hepatitis C (cured), osteoporosis, L shoulder RTR, L chronic L shoulder pain.   PAIN:  Are you having pain? Yes: NPRS scale: 5-6/10 Pain location: across low back radiating down both posterior thighs Pain description: constant dull ache Aggravating factors: increases with activity, standing for  long periods with cooking Relieving factors: ice, laying down and resting  PRECAUTIONS: None  WEIGHT BEARING RESTRICTIONS: No  FALLS:  Has patient fallen in last 6 months? 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 At L5-S1, significantly decreased size of the previously seen right subarticular disc protrusion. Small amount of residual disc in the right subarticular recess which contacts the descending right S1 nerve roots without clear impingement. Mild bilateral foraminal stenosis, slightly progressed. 2. At L4-L5, progression of now mild canal and bilateral subarticular recess stenosis with similar mild to moderate right and mild left foraminal stenosis. 3. At L3-L4, similar borderline bilateral subarticular recess stenosis and mild bilateral foraminal stenosis. 4. Sludge with 2 cm stone in the gallbladder with similar findings on the prior. The common bile duct is mildly dilated (approximately 7 mm) which may be in part chronic given some dilation on the prior. There is suggestion of possible filling defect within the distal common bile duct (series 5, image 17), although evaluation is limited on this study. Correlation with liver enzymes may be helpful. If there is clinical concern for obstruction then MRCP could further evaluate.  PATIENT SURVEYS:  Modified Oswestry 18/50= 36%   SCREENING FOR RED  FLAGS: Bowel or bladder incontinence: No Spinal tumors: No Cauda equina syndrome: No Compression fracture: No Abdominal aneurysm: No  COGNITION: Overall cognitive status: Within functional limits for tasks assessed     SENSATION: WFL Tingling in bil anterior thighs  MUSCLE LENGTH: Hamstrings: Right ~75 deg moderate tightness; Left 90 deg Quadriceps - moderate tightness bil  POSTURE: decreased lumbar lordosis  PALPATION: Diffuse tenderness throughout bil lumbar paraspinals, lumbar spine with PA mobs, bil SIJ, bil QL, bil glute med.  Noted muscle atrophy in R piriformis.   LUMBAR ROM:   AROM eval  Flexion To ankles, mod pain  Extension WNL, dec pain  Right lateral flexion To knee  Left lateral flexion To knee, inc pain  Right rotation WNL, inc pain  Left rotation WNL, inc pain   (Blank rows = not tested)  LOWER EXTREMITY ROM:    L hip tight with ER, good IR, pinch with FABER, 90 SLR, min HS tightness, on R HS tighter, hip scour pos, Faber neg (felt like good stretch), hip ROM = to L.    LOWER EXTREMITY MMT:    MMT Right eval Left eval  Hip flexion 4+ 4+  Hip extension 3+ 4  Hip abduction 5 5  Hip adduction 4+ 4+  Knee flexion 5 5  Knee extension 5 5  Ankle dorsiflexion 5 5   (Blank rows = not tested)  LUMBAR SPECIAL TESTS:  Straight leg raise test: Negative and FABER test: reports pinching in back on L.  Positive scour test on R.   GAIT: Comments: WNL, no deviation or device  TODAY'S TREATMENT:  DATE:   10/09/2022 Therapeutic Exercise: to improve strength and mobility.  Demo, verbal and tactile cues throughout for technique. Bike - discontinued aggravating Knee rocks- aggravating Knee rocks with TrA- slightly better but still aggravating Diaphragmatic breathing with TrA - no pain Bent knee fall outs x 10 alternating Manual Therapy:  to decrease muscle spasm and pain and improve mobility STM/TPR to lumbar paraspinals, QL and glutes in S/L.  IASTM with s/s edge to bil QL  09/13/2022 Therapeutic Exercise: to improve strength and mobility.  Demo, verbal and tactile cues throughout for technique. Osteoporosis Exercises a la Meeks: Spinal Decompression with diaphragmatic breathing Shoulder press Head Press Leg lengthener Leg Press Manual Therapy: to decrease muscle spasm and pain and improve mobility STM/TPR to lumbar paraspinals, QL and glutes in S/L.   08/15/22:Therapeutic exercises:  instructed/ guided the patient in therex as described below with emphasis on improving upright posture through thoracic spine and ant hips , educated pt throughout on pacing activities and positioning, posture to reduce compressive forces LS spine. All exercises performed to tolerance, 10 to 20 reps each  Supine , one leg over peanut physioball, therapist assisted with very light manual stretching hip adductors and flexors each leg to tolerance Supine with B legs over peanut physioball for bridging, avoiding excessive torque lumbar region and isolating B gluteals Supine B legs over peanut for isometric B shoulder horizontal abduction combined with B shoulder flexion to engage, recruit scapular retractors Standing B shoulder flexion, wall slides to improve, decompress thoracic spine    PATIENT EDUCATION:  Education details: HEP update Person educated: Patient Education method: Programmer, multimedia, Facilities manager, Verbal cues, and Handouts Education comprehension: verbalized understanding and returned demonstration  HOME EXERCISE PROGRAM: Access Code: [email or web address] URL: [email or web address] Date: 10/09/2022 Prepared by: Harrie Foreman  Exercises - Supine Diaphragmatic Breathing  - 2 x daily - 7 x weekly - 1 sets - 10 reps - Bent Knee Fallouts  - 2 x daily - 7 x weekly - 1 sets - 10 reps  ASSESSMENT:  CLINICAL IMPRESSION: Lindsey Waters reports significant pain relief with gentle decompression exercises, performing multiple times a day.  Tried to advance exercises today, very sensative to any lumbar movement but was able to tolerate diaphragmatic breathing and bent knee fallouts, so added to HEP, followed by manual therapy again in s/l, to decrease muscle spasm in low back.  Due to missed visits due to medical appts. And upcoming medical medical visits, but recent improvement with therapy, extending her POC for additional 8 weeks.  Lindsey Waters continues to demonstrate potential for improvement and would benefit from continued skilled therapy to address impairments.        OBJECTIVE IMPAIRMENTS: decreased activity tolerance, decreased ROM, decreased strength, increased muscle spasms, impaired sensation, and pain.   ACTIVITY LIMITATIONS: carrying, lifting, bending, standing, squatting, sleeping, dressing, locomotion level, and caring for others  PARTICIPATION LIMITATIONS: meal prep, cleaning, laundry, driving, shopping, community activity, and occupation  PERSONAL FACTORS: Age, Time since onset of injury/illness/exacerbation, and 1-2 comorbidities: History chronic LBP and previous back surgery (lumbar diskectomy L5-S1 2019),  osteoporosis, L shoulder RTR, L chronic L shoulder pain   are also affecting patient's functional outcome.   REHAB POTENTIAL: Good  CLINICAL DECISION MAKING: Evolving/moderate complexity  EVALUATION COMPLEXITY: Moderate  GOALS: Goals reviewed with patient? Yes  SHORT TERM GOALS: Target date: 07/27/2022   Patient will be independent with initial HEP.  Baseline:  Goal status: MET  09/13/22  LONG TERM GOALS: Target date: 12/04/2022  Patient will be independent with advanced/ongoing HEP to improve outcomes and carryover.  Baseline:  Goal status: IN PROGRESS 09/13/22- updated with osteoporosis exercises  2.  Patient will report 50% improvement in low back pain to improve QOL.  Baseline:  Goal  status: IN PROGRESS- 09/13/22- no change  3.  Patient will demonstrate improved functional strength as demonstrated by 5/5 hip strength. Baseline: see objective Goal status: IN PROGRESS  4.  Patient will report at least 6 points improvement on modified Oswestry to demonstrate improved functional ability.  Baseline: 18/50 Goal status: IN PROGRESS   5.  Patient will tolerate standing for an hour without increased low back pain in order to cook.   Baseline: has to stop, lay down, ice back Goal status: IN PROGRESS  PLAN:  PT FREQUENCY: 1-2x/week  PT DURATION: 8 weeks  PLANNED INTERVENTIONS: Therapeutic exercises, Therapeutic activity, Neuromuscular re-education, Balance training, Gait training, Patient/Family education, Self Care, Joint mobilization, Joint manipulation, Dry Needling, Electrical stimulation, Spinal manipulation, Spinal mobilization, Cryotherapy, Moist heat, Taping, Traction, Ultrasound, Manual therapy, and Re-evaluation.  PLAN FOR NEXT SESSION: gentle progression of core/hip extensor strengthening, discuss TrDN to multifidi with Estim    Jena Gauss, PT, DPT  10/09/2022, 4:24 PM

## 2022-10-23 ENCOUNTER — Ambulatory Visit: Payer: Medicare PPO

## 2022-11-01 ENCOUNTER — Ambulatory Visit: Payer: Medicare PPO | Attending: Physical Medicine and Rehabilitation | Admitting: Physical Therapy

## 2022-11-01 ENCOUNTER — Encounter: Payer: Self-pay | Admitting: Physical Therapy

## 2022-11-01 DIAGNOSIS — M5459 Other low back pain: Secondary | ICD-10-CM | POA: Insufficient documentation

## 2022-11-01 DIAGNOSIS — M6281 Muscle weakness (generalized): Secondary | ICD-10-CM | POA: Diagnosis present

## 2022-11-01 NOTE — Patient Instructions (Signed)

## 2022-11-01 NOTE — Therapy (Signed)
OUTPATIENT PHYSICAL THERAPY THORACOLUMBAR TREATMENT   Patient Name: Lindsey Waters MRN: [id number] DOB:[birth year], 72 y.o., female Today's Date: 11/01/2022  END OF SESSION:  PT End of Session - 11/01/22 1320     Visit Number 6    Number of Visits 12    Date for PT Re-Evaluation 12/04/22    Authorization Type [employer] M[employer] + [employer]    Authorization Time Period 8 additional approved    Progress Note Due on Visit 10    PT Start Time 1318    PT Stop Time 1400    PT Time Calculation (min) 42 min    Activity Tolerance Patient tolerated treatment well    Behavior During Therapy WFL for tasks assessed/performed               History reviewed. No pertinent past medical history. History reviewed. No pertinent surgical history. There are no problems to display for this patient.   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  She does them frequently throughout the day and just helps her relax and manage her pain.  Sees [facility]. Next Tuesday about ECRP.     PERTINENT HISTORY:  History chronic LBP and previous back surgery (lumbar diskectomy L5-S1 2019), Hepatitis C (cured), osteoporosis, L shoulder RTR, L chronic L shoulder pain.   PAIN:  Are you having pain? Yes: NPRS scale: 6/10 Pain location: across low back radiating down both posterior thighs Pain description: constant dull ache Aggravating factors:  increases with activity, standing for long periods with cooking Relieving factors: ice, laying down and resting  PRECAUTIONS: None  WEIGHT BEARING RESTRICTIONS: No  FALLS:  Has patient fallen in last 6 months? 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 At L5-S1, significantly decreased size of the previously seen right subarticular disc protrusion. Small amount of residual disc in the right subarticular recess which contacts the descending right S1 nerve roots without clear impingement. Mild bilateral foraminal stenosis, slightly progressed. 2. At L4-L5, progression of now mild canal and bilateral subarticular recess stenosis with similar mild to moderate right and mild left foraminal stenosis. 3. At L3-L4, similar borderline bilateral subarticular recess stenosis and mild bilateral foraminal stenosis. 4. Sludge with 2 cm stone in the gallbladder with similar findings on the prior. The common bile duct is mildly dilated (approximately 7 mm) which may be in part chronic given some dilation on the prior. There is suggestion of possible filling defect within the distal common bile duct (series 5, image 17), although evaluation is limited on this study. Correlation with liver enzymes may be helpful. If there is clinical concern for obstruction then MRCP could further evaluate.  PATIENT SURVEYS:  Modified  Oswestry 18/50= 36%   SCREENING FOR RED FLAGS: Bowel or bladder incontinence: No Spinal tumors: No Cauda equina syndrome: No Compression fracture: No Abdominal aneurysm: No  COGNITION: Overall cognitive status: Within functional limits for tasks assessed     SENSATION: WFL Tingling in bil anterior thighs  MUSCLE LENGTH: Hamstrings: Right ~75 deg moderate tightness; Left 90 deg Quadriceps - moderate tightness bil  POSTURE: decreased lumbar lordosis  PALPATION: Diffuse tenderness throughout bil lumbar paraspinals, lumbar spine with PA mobs, bil SIJ, bil QL, bil glute med.  Noted muscle atrophy in R piriformis.   LUMBAR ROM:   AROM eval  Flexion To ankles, mod pain  Extension WNL, dec pain  Right lateral flexion To knee  Left lateral flexion To knee, inc pain  Right rotation WNL, inc pain  Left rotation WNL, inc pain   (Blank rows = not tested)  LOWER EXTREMITY ROM:    L hip tight with ER, good IR, pinch with FABER, 90 SLR, min HS tightness, on R HS tighter, hip scour pos, Faber neg (felt like good stretch), hip ROM = to L.    LOWER EXTREMITY MMT:    MMT Right eval Left eval  Hip flexion 4+ 4+  Hip extension 3+ 4  Hip abduction 5 5  Hip adduction 4+ 4+  Knee flexion 5 5  Knee extension 5 5  Ankle dorsiflexion 5 5   (Blank rows = not tested)  LUMBAR SPECIAL TESTS:  Straight leg raise test: Negative and FABER test: reports pinching in back on L.  Positive scour test on R.   GAIT: Comments: WNL, no deviation or device  TODAY'S TREATMENT:  DATE:   11/01/2022 Manual Therapy: to decrease muscle spasm and pain and improve mobility STM/TPR to lumbar erector spinae, QL, glutes in S/L.  IASTM with edge tool to glutes and proximal hamstrings. Skilled palpation and monitoring during dry needling. Trigger Point Dry-Needling  Treatment  instructions: Expect mild to moderate muscle soreness. S/S of pneumothorax if dry needled over a lung field, and to seek immediate medical attention should they occur. Patient verbalized understanding of these instructions and education. Patient Consent Given: Yes Education handout provided: Yes Muscles treated: bil glut med and piriformis Electrical stimulation performed: No Parameters: N/A Treatment response/outcome: Twitch Response Elicited and Palpable Increase in Muscle Length   10/09/2022 Therapeutic Exercise: to improve strength and mobility.  Demo, verbal and tactile cues throughout for technique. Bike - discontinued aggravating Knee rocks- aggravating Knee rocks with TrA- slightly better but still aggravating Diaphragmatic breathing with TrA - no pain Bent knee fall outs x 10 alternating Manual Therapy: to decrease muscle spasm and pain and improve mobility STM/TPR to lumbar paraspinals, QL and glutes in S/L.  IASTM with s/s edge to bil QL  09/13/2022 Therapeutic Exercise: to improve strength and mobility.  Demo, verbal and tactile cues throughout for technique. Osteoporosis Exercises a la Meeks: Spinal Decompression with diaphragmatic breathing Shoulder press Head Press Leg lengthener Leg Press Manual Therapy: to decrease muscle spasm and pain and improve mobility STM/TPR to lumbar paraspinals, QL and glutes in S/L.   PATIENT EDUCATION:  Education details: TrDN Person educated: Patient Education method: Chief Technology Officer Education comprehension: verbalized understanding  HOME EXERCISE PROGRAM: Access Code: [email or web address] URL: [email or web address] Date: 10/09/2022 Prepared by: Harrie Foreman  Exercises - Supine Diaphragmatic Breathing  - 2 x daily - 7 x weekly - 1 sets - 10 reps - Bent Knee Fallouts  - 2 x daily - 7 x weekly - 1 sets - 10 reps  ASSESSMENT:  CLINICAL IMPRESSION: Mariaceleste Herrera reports continued benefit from decompression  exercises, performing multiple times a day as needed to manage pain.  She has not had time to try other exercises.  She was interested in dry needling, as her husband has had good benefit, so we discussed pros and cons at length before deciding on short trial to bilateral glutes, as this is where she has most of her pain, and due to history of low back surgery preferred not to needle in this location.  She did have palpable trigger points in both glutes, and tolerated very well with immediate decrease in tightness and spasm.  Buffey Zabinski continues to demonstrate potential for improvement and would benefit from continued skilled therapy to address impairments.        OBJECTIVE IMPAIRMENTS: decreased activity tolerance, decreased ROM, decreased strength, increased muscle spasms, impaired sensation, and pain.   ACTIVITY LIMITATIONS: carrying, lifting, bending, standing, squatting, sleeping, dressing, locomotion level, and caring for others  PARTICIPATION LIMITATIONS: meal prep, cleaning, laundry, driving, shopping, community activity, and occupation  PERSONAL FACTORS: Age, Time since onset of injury/illness/exacerbation, and 1-2 comorbidities: History chronic LBP and previous back surgery (lumbar diskectomy L5-S1 2019),  osteoporosis, L shoulder RTR, L chronic L shoulder pain   are also affecting patient's functional outcome.   REHAB POTENTIAL: Good  CLINICAL DECISION MAKING: Evolving/moderate complexity  EVALUATION COMPLEXITY: Moderate  GOALS: Goals reviewed with patient? Yes  SHORT TERM GOALS: Target date: 07/27/2022   Patient will be independent with initial HEP.  Baseline:  Goal status: MET  09/13/22  LONG TERM GOALS: Target date: 12/04/2022  Patient will be independent with advanced/ongoing HEP to improve outcomes and carryover.  Baseline:  Goal status: IN PROGRESS 09/13/22- updated with osteoporosis exercises  2.  Patient will report 50% improvement in low back pain to improve QOL.   Baseline:  Goal status: IN PROGRESS- 09/13/22- no change  3.  Patient will demonstrate improved functional strength as demonstrated by 5/5 hip strength. Baseline: see objective Goal status: IN PROGRESS  4.  Patient will report at least 6 points improvement on modified Oswestry to demonstrate improved functional ability.  Baseline: 18/50 Goal status: IN PROGRESS   5.  Patient will tolerate standing for an hour without increased low back pain in order to cook.   Baseline: has to stop, lay down, ice back Goal status: IN PROGRESS  PLAN:  PT FREQUENCY: 1-2x/week  PT DURATION: 8 weeks  PLANNED INTERVENTIONS: Therapeutic exercises, Therapeutic activity, Neuromuscular re-education, Balance training, Gait training, Patient/Family education, Self Care, Joint mobilization, Joint manipulation, Dry Needling, Electrical stimulation, Spinal manipulation, Spinal mobilization, Cryotherapy, Moist heat, Taping, Traction, Ultrasound, Manual therapy, and Re-evaluation.  PLAN FOR NEXT SESSION: gentle progression of core/hip extensor strengthening, assess response to trigger point DN, caution does have history of Low back surgery  Jena Gauss, PT, DPT  11/01/2022, 3:57 PM

## 2022-11-08 ENCOUNTER — Encounter: Admitting: Physical Therapy

## 2022-11-16 ENCOUNTER — Ambulatory Visit: Payer: Medicare PPO

## 2022-11-16 DIAGNOSIS — M5459 Other low back pain: Secondary | ICD-10-CM

## 2022-11-16 DIAGNOSIS — M6281 Muscle weakness (generalized): Secondary | ICD-10-CM

## 2022-11-16 NOTE — Therapy (Signed)
OUTPATIENT PHYSICAL THERAPY THORACOLUMBAR TREATMENT   Patient Name: Lindsey Waters MRN: [id number] DOB:07/27/1950, 72 y.o., female Today's Date: 11/16/2022  END OF SESSION:  PT End of Session - 11/16/22 1409     Visit Number 7    Number of Visits 12    Date for PT Re-Evaluation 12/04/22    Authorization Type [employer] M[employer] + [employer]    Authorization Time Period 8 additional approved    Progress Note Due on Visit 10    PT Start Time 1315    PT Stop Time 1400    PT Time Calculation (min) 45 min    Activity Tolerance Patient tolerated treatment well    Behavior During Therapy WFL for tasks assessed/performed                History reviewed. No pertinent past medical history. History reviewed. No pertinent surgical history. There are no problems to display for this patient.   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   PAIN:  Are you having pain? Yes: NPRS scale: 6/10 Pain location: across low back radiating down both posterior thighs Pain description: constant dull ache Aggravating factors: increases with activity, standing for long periods with cooking Relieving factors: ice, laying down and resting  PRECAUTIONS: None  WEIGHT BEARING RESTRICTIONS:  No  FALLS:  Has patient fallen in last 6 months? 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 At L5-S1, significantly decreased size of the previously seen right subarticular disc protrusion. Small amount of residual disc in the right subarticular recess which contacts the descending right S1 nerve roots without clear impingement. Mild bilateral foraminal stenosis, slightly progressed. 2. At L4-L5, progression of now mild canal and bilateral subarticular recess stenosis with similar mild to moderate right and mild left foraminal stenosis. 3. At L3-L4, similar borderline bilateral subarticular recess stenosis and mild bilateral foraminal stenosis. 4. Sludge with 2 cm stone in the gallbladder with similar findings on the prior. The common bile duct is mildly dilated (approximately 7 mm) which may be in part chronic given some dilation on the prior. There is suggestion of possible filling defect within the distal common bile duct (series 5, image 17), although evaluation is limited on this study. Correlation with liver enzymes may be helpful. If there is clinical concern for obstruction then MRCP could further evaluate.  PATIENT SURVEYS:  Modified Oswestry 18/50= 36%   SCREENING FOR RED FLAGS: Bowel or bladder incontinence: No Spinal tumors: No Cauda equina syndrome: No Compression fracture: No Abdominal  aneurysm: No  COGNITION: Overall cognitive status: Within functional limits for tasks assessed     SENSATION: WFL Tingling in bil anterior thighs  MUSCLE LENGTH: Hamstrings: Right ~75 deg moderate tightness; Left 90 deg Quadriceps - moderate tightness bil  POSTURE: decreased lumbar lordosis  PALPATION: Diffuse tenderness throughout bil lumbar paraspinals, lumbar spine with PA mobs, bil SIJ, bil QL, bil glute med.  Noted muscle atrophy in R piriformis.   LUMBAR ROM:   AROM eval  Flexion To ankles, mod pain  Extension WNL, dec pain  Right lateral flexion To knee  Left lateral flexion To knee, inc pain  Right rotation WNL, inc pain  Left rotation WNL, inc pain   (Blank rows = not tested)  LOWER EXTREMITY ROM:    L hip tight with ER, good IR, pinch with FABER, 90 SLR, min HS tightness, on R HS tighter, hip scour pos, Faber neg (felt like good stretch), hip ROM = to L.    LOWER EXTREMITY MMT:    MMT Right eval Left eval  Hip flexion 4+ 4+  Hip extension 3+ 4  Hip abduction 5 5  Hip adduction 4+ 4+  Knee flexion 5 5  Knee extension 5 5  Ankle dorsiflexion 5 5   (Blank rows = not tested)  LUMBAR SPECIAL TESTS:  Straight leg raise test: Negative and FABER test: reports pinching in back on L.  Positive scour test on R.   GAIT: Comments: WNL, no deviation or device  TODAY'S TREATMENT:                                                                                                                              DATE:  11/16/22 Manual Therapy: to decrease muscle spasm and pain and improve mobility IASTM/TPR to glutes, ITB, proximal HS in S/L.  Therapeutic Exercise: to improve strength and mobility.  Demo, verbal and tactile cues throughout for technique. Shoulder press in supine Head press supine x 10  SKTC 3x5" bil  11/01/2022 Manual Therapy: to decrease muscle spasm and pain and improve mobility STM/TPR to lumbar erector spinae, QL, glutes in S/L.  IASTM with edge  tool to glutes and proximal hamstrings. Skilled palpation and monitoring during dry needling. Trigger Point Dry-Needling  Treatment instructions: Expect mild to moderate muscle soreness. S/S of pneumothorax if dry needled over a lung field, and to seek immediate medical attention should they occur. Patient verbalized understanding of these instructions and education. Patient Consent Given: Yes Education handout provided: Yes Muscles treated: bil glut med and piriformis Electrical stimulation performed: No Parameters: N/A Treatment response/outcome: Twitch Response Elicited and Palpable Increase in Muscle Length   10/09/2022 Therapeutic Exercise: to improve strength and mobility.  Demo, verbal and tactile cues throughout for technique. Bike - discontinued aggravating Knee rocks- aggravating Knee rocks with TrA- slightly better but still aggravating Diaphragmatic breathing with TrA - no pain Bent knee fall outs x 10 alternating Manual Therapy: to decrease muscle spasm and pain and improve mobility STM/TPR to lumbar paraspinals, QL and glutes in S/L.  IASTM with s/s edge to bil QL  09/13/2022 Therapeutic Exercise: to improve strength and mobility.  Demo, verbal and tactile cues throughout for technique. Osteoporosis Exercises a la Meeks: Spinal Decompression with diaphragmatic breathing Shoulder press Head Press Leg lengthener Leg Press Manual Therapy: to decrease muscle spasm and pain and improve mobility STM/TPR to lumbar paraspinals, QL and glutes in S/L.   PATIENT EDUCATION:  Education details: TrDN Person educated: Patient Education method: Chief Technology Officer Education comprehension: verbalized understanding  HOME EXERCISE PROGRAM: Access Code: [email or web address] URL: [email or web address] Date: 10/09/2022 Prepared by: Harrie Foreman  Exercises - Supine Diaphragmatic Breathing  - 2 x daily - 7 x weekly - 1 sets - 10 reps - Bent Knee Fallouts  - 2 x daily - 7  x weekly - 1 sets - 10 reps  ASSESSMENT:  CLINICAL IMPRESSION: Focused session on STM to address tightness in hips. Gentle lumbo-pelvic exercises followed for spinal decompression. Standing still very limited d/t LBP as she needs to break it up into segments. Latayvia Hayenga continues to demonstrate potential for improvement and would benefit from continued skilled therapy to address impairments.        OBJECTIVE IMPAIRMENTS: decreased activity tolerance, decreased ROM, decreased strength, increased muscle spasms, impaired sensation, and pain.   ACTIVITY LIMITATIONS: carrying, lifting, bending, standing, squatting, sleeping, dressing, locomotion level, and caring for others  PARTICIPATION LIMITATIONS: meal prep, cleaning, laundry, driving, shopping, community activity, and occupation  PERSONAL FACTORS: Age, Time since onset of injury/illness/exacerbation, and 1-2 comorbidities: History chronic LBP and previous back surgery (lumbar diskectomy L5-S1 2019),  osteoporosis, L shoulder RTR, L chronic L shoulder pain   are also affecting patient's functional outcome.   REHAB POTENTIAL: Good  CLINICAL DECISION MAKING: Evolving/moderate complexity  EVALUATION COMPLEXITY: Moderate  GOALS: Goals reviewed with patient? Yes  SHORT TERM GOALS: Target date: 07/27/2022   Patient will be independent with initial HEP.  Baseline:  Goal status: MET  09/13/22  LONG TERM GOALS: Target date: 12/04/2022    Patient will be independent with advanced/ongoing HEP to improve outcomes and carryover.  Baseline:  Goal status: IN PROGRESS 09/13/22- updated with osteoporosis exercises  2.  Patient will report 50% improvement in low back pain to improve QOL.  Baseline:  Goal status: IN PROGRESS- 09/13/22-  no change  3.  Patient will demonstrate improved functional strength as demonstrated by 5/5 hip strength. Baseline: see objective Goal status: IN PROGRESS  4.  Patient will report at least 6 points improvement  on modified Oswestry to demonstrate improved functional ability.  Baseline: 18/50 Goal status: IN PROGRESS   5.  Patient will tolerate standing for an hour without increased low back pain in order to cook.   Baseline: has to stop, lay down, ice back Goal status: IN PROGRESS  PLAN:  PT FREQUENCY: 1-2x/week  PT DURATION: 8 weeks  PLANNED INTERVENTIONS: Therapeutic exercises, Therapeutic activity, Neuromuscular re-education, Balance training, Gait training, Patient/Family education, Self Care, Joint mobilization, Joint manipulation, Dry Needling, Electrical stimulation, Spinal manipulation, Spinal mobilization, Cryotherapy, Moist heat, Taping, Traction, Ultrasound, Manual therapy, and Re-evaluation.  PLAN FOR NEXT SESSION: gentle progression of core/hip extensor strengthening, assess response to trigger point DN, caution does have history of Low back surgery  Darleene Cleaver, PTA 11/16/2022, 2:10 PM

## 2022-11-23 ENCOUNTER — Ambulatory Visit: Payer: Medicare PPO | Admitting: Physical Therapy

## 2022-11-23 ENCOUNTER — Encounter: Payer: Self-pay | Admitting: Physical Therapy

## 2022-11-23 DIAGNOSIS — M5459 Other low back pain: Secondary | ICD-10-CM | POA: Diagnosis not present

## 2022-11-23 DIAGNOSIS — M6281 Muscle weakness (generalized): Secondary | ICD-10-CM

## 2022-11-23 NOTE — Therapy (Signed)
OUTPATIENT PHYSICAL THERAPY THORACOLUMBAR TREATMENT   Patient Name: Lindsey Waters MRN: [id number] DOB:1950/09/21, 72 y.o., female Today's Date: 11/23/2022  END OF SESSION:  PT End of Session - 11/23/22 1316     Visit Number 8    Number of Visits 12    Date for PT Re-Evaluation 12/04/22    Authorization Type [employer] + [employer]    Authorization Time Period 8 additional approved    Progress Note Due on Visit 10    PT Start Time 1316    PT Stop Time 1350    PT Time Calculation (min) 34 min    Activity Tolerance Patient tolerated treatment well    Behavior During Therapy WFL for tasks assessed/performed                History reviewed. No pertinent past medical history. History reviewed. No pertinent surgical history. There are no problems to display for this patient.   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  Felt the TrDN helped would like to try again.  Does not recall anything causing the pain.  RLE is swollen, especially around the knee    PERTINENT HISTORY:  History chronic LBP and previous back surgery (lumbar diskectomy L5-S1 2019), Hepatitis C (cured), osteoporosis, L shoulder RTR, L chronic L shoulder pain.   PAIN:  Are you having pain? Yes: NPRS scale: 5/10 Pain location: R knee/hip Pain description: constant dull ache Aggravating factors: increases  with activity, standing for long periods with cooking Relieving factors: ice, laying down and resting  PRECAUTIONS: None  WEIGHT BEARING RESTRICTIONS: No  FALLS:  Has patient fallen in last 6 months? 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 At L5-S1, significantly decreased size of the previously seen right subarticular disc protrusion. Small amount of residual disc in the right subarticular recess which contacts the descending right S1 nerve roots without clear impingement. Mild bilateral foraminal stenosis, slightly progressed. 2. At L4-L5, progression of now mild canal and bilateral subarticular recess stenosis with similar mild to moderate right and mild left foraminal stenosis. 3. At L3-L4, similar borderline bilateral subarticular recess stenosis and mild bilateral foraminal stenosis. 4. Sludge with 2 cm stone in the gallbladder with similar findings on the prior. The common bile duct is mildly dilated (approximately 7 mm) which may be in part chronic given some dilation on the prior. There is suggestion of possible filling defect within the distal common bile duct (series 5, image 17), although evaluation is limited on this study. Correlation with liver enzymes may be helpful. If there is clinical concern for obstruction then MRCP could further evaluate.  PATIENT SURVEYS:  Modified Oswestry 18/50=  36%   SCREENING FOR RED FLAGS: Bowel or bladder incontinence: No Spinal tumors: No Cauda equina syndrome: No Compression fracture: No Abdominal aneurysm: No  COGNITION: Overall cognitive status: Within functional limits for tasks assessed     SENSATION: WFL Tingling in bil anterior thighs  MUSCLE LENGTH: Hamstrings: Right ~75 deg moderate tightness; Left 90 deg Quadriceps - moderate tightness bil  POSTURE: decreased lumbar lordosis  PALPATION: Diffuse tenderness throughout bil lumbar paraspinals, lumbar spine with PA mobs, bil SIJ, bil QL, bil glute med.  Noted muscle atrophy in R piriformis.   LUMBAR ROM:   AROM eval  Flexion To ankles, mod pain  Extension WNL, dec pain  Right lateral flexion To knee  Left lateral flexion To knee, inc pain  Right rotation WNL, inc pain  Left rotation WNL, inc pain   (Blank rows = not tested)  LOWER EXTREMITY ROM:    L hip tight with ER, good IR, pinch with FABER, 90 SLR, min HS tightness, on R HS tighter, hip scour pos, Faber neg (felt like good stretch), hip ROM = to L.    LOWER EXTREMITY MMT:    MMT Right eval Left eval  Hip flexion 4+ 4+  Hip extension 3+ 4  Hip abduction 5 5  Hip adduction 4+ 4+  Knee flexion 5 5  Knee extension 5 5  Ankle dorsiflexion 5 5   (Blank rows = not tested)  LUMBAR SPECIAL TESTS:  Straight leg raise test: Negative and FABER test: reports pinching in back on L.  Positive scour test on R.   GAIT: Comments: WNL, no deviation or device  TODAY'S TREATMENT:  DATE:   11/23/22 Manual Therapy: to decrease muscle spasm and pain and improve mobility STM/TPR to L glutes, QL, piriformis, Itband distally, skilled palpation and monitoring during dry needling. Trigger Point Dry-Needling  Treatment instructions: Expect mild to moderate muscle soreness. S/S of pneumothorax if dry  needled over a lung field, and to seek immediate medical attention should they occur. Patient verbalized understanding of these instructions and education. Patient Consent Given: Yes Education handout provided: Previously provided Muscles treated: L glute med, L TFL, L lateral hamstring and quad around distal ITband Electrical stimulation performed: No Parameters: N/A Treatment response/outcome: Twitch Response Elicited and Palpable Increase in Muscle Length  11/16/22 Manual Therapy: to decrease muscle spasm and pain and improve mobility IASTM/TPR to glutes, ITB, proximal HS in S/L.    Therapeutic Exercise: to improve strength and mobility.  Demo, verbal and tactile cues throughout for technique. Shoulder press in supine Head press supine x 10  SKTC 3x5" bil  11/01/2022 Manual Therapy: to decrease muscle spasm and pain and improve mobility STM/TPR to lumbar erector spinae, QL, glutes in S/L.  IASTM with edge tool to glutes and proximal hamstrings. Skilled palpation and monitoring during dry needling. Trigger Point Dry-Needling  Treatment instructions: Expect mild to moderate muscle soreness. S/S of pneumothorax if dry needled over a lung field, and to seek immediate medical attention should they occur. Patient verbalized understanding of these instructions and education. Patient Consent Given: Yes Education handout provided: Yes Muscles treated: bil glut med and piriformis Electrical stimulation performed: No Parameters: N/A Treatment response/outcome: Twitch Response Elicited and Palpable Increase in Muscle Length   10/09/2022 Therapeutic Exercise: to improve strength and mobility.  Demo, verbal and tactile cues throughout for technique. Bike - discontinued aggravating Knee rocks- aggravating Knee rocks with TrA- slightly better but still aggravating Diaphragmatic breathing with TrA - no pain Bent knee fall outs x 10 alternating Manual Therapy: to decrease muscle spasm and pain and  improve mobility STM/TPR to lumbar paraspinals, QL and glutes in S/L.  IASTM with s/s edge to bil QL    PATIENT EDUCATION:  Education details: TrDN Person educated: Patient Education method: Chief Technology Officer Education comprehension: verbalized understanding  HOME EXERCISE PROGRAM: Access Code: [email or web address] URL: [email or web address] Date: 10/09/2022 Prepared by: Harrie Foreman  Exercises - Supine Diaphragmatic Breathing  - 2 x daily - 7 x weekly - 1 sets - 10 reps - Bent Knee Fallouts  - 2 x daily - 7 x weekly - 1 sets - 10 reps  ASSESSMENT:  CLINICAL IMPRESSION: Lindsey Waters reports 40% improvement overall in LBP since starting PT.  She reports she tolerated more activity, and with exercises able to take short breaks to manage pain better, so not as severe at the end of the day.  She did have some RLE swelling today, reporting R thigh/knee pain still, decreased after manual therapy.  At this time she would like to be placed on 30 day hold, as she is very busy with appointments and planning on surgery later in month, so not sure she will be able to return to PT.       OBJECTIVE IMPAIRMENTS: decreased activity tolerance, decreased ROM, decreased strength, increased muscle spasms, impaired sensation, and pain.   ACTIVITY LIMITATIONS: carrying, lifting, bending, standing, squatting, sleeping, dressing, locomotion level, and caring for others  PARTICIPATION LIMITATIONS: meal prep, cleaning, laundry, driving, shopping, community activity, and occupation  PERSONAL FACTORS: Age, Time since onset of injury/illness/exacerbation, and 1-2 comorbidities: History chronic LBP and previous back surgery (  lumbar diskectomy L5-S1 2019),  osteoporosis, L shoulder RTR, L chronic L shoulder pain   are also affecting patient's functional outcome.   REHAB POTENTIAL: Good  CLINICAL DECISION MAKING: Evolving/moderate complexity  EVALUATION COMPLEXITY: Moderate  GOALS: Goals  reviewed with patient? Yes  SHORT TERM GOALS: Target date: 07/27/2022   Patient will be independent with initial HEP.  Baseline:  Goal status: MET  09/13/22  LONG TERM GOALS: Target date: 12/04/2022    Patient will be independent with advanced/ongoing HEP to improve outcomes and carryover.  Baseline:  Goal status: MET 09/13/22- updated with osteoporosis exercises  11/23/22- met. Performs daily  2.  Patient will report 50% improvement in low back pain to improve QOL.  Baseline:  Goal status: IN PROGRESS- 09/13/22- no change 11/23/22 - 40% improvement. Reports feeling more mobility, able to manage now during daily with less pain end of day.   3.  Patient will demonstrate improved functional strength as demonstrated by 5/5 hip strength. Baseline: see objective Goal status: IN PROGRESS    4.  Patient will report at least 6 points improvement on modified Oswestry to demonstrate improved functional ability.  Baseline: 18/50 Goal status: IN PROGRESS   5.  Patient will tolerate standing for an hour without increased low back pain in order to cook.   Baseline: has to stop, lay down, ice back Goal status: IN PROGRESS 11/23/22- able to do more during day, only needs short breaks now for relief.    PLAN:  PT FREQUENCY: 1-2x/week  PT DURATION: 8 weeks  PLANNED INTERVENTIONS: Therapeutic exercises, Therapeutic activity, Neuromuscular re-education, Balance training, Gait training, Patient/Family education, Self Care, Joint mobilization, Joint manipulation, Dry Needling, Electrical stimulation, Spinal manipulation, Spinal mobilization, Cryotherapy, Moist heat, Taping, Traction, Ultrasound, Manual therapy, and Re-evaluation.  PLAN FOR NEXT SESSION: 30 day hold.   Jena Gauss, PT 11/23/2022, 3:07 PM

## 2023-01-03 IMAGING — MR MR LUMBAR SPINE WO/W CM
4 of 7 series · 22 of 48 positions shown · IV contrast (10ml Multihance)
Comparison: 01/06/2017.

CLINICAL DATA: Low back pain and numbness in bilateral legs.

EXAM:
MRI LUMBAR SPINE WITHOUT AND WITH CONTRAST
TECHNIQUE: Multiplanar and multiecho pulse sequences of the lumbar spine were
obtained without and with intravenous contrast.
CONTRAST:  10mL MULTIHANCE GADOBENATE DIMEGLUMINE 529 MG/ML IV SOLN

[Series 3: T1 · sagittal · 4.0mm · 1.09mm/px · 3 of 15 slices shown (1 of 2)]
[im 1/15]
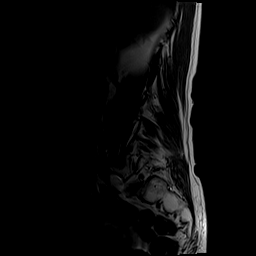
[im 8/15]
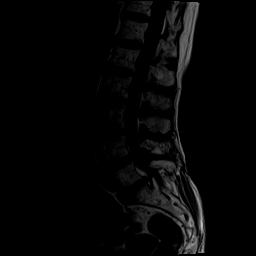
[im 15/15]
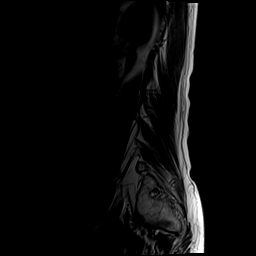

[Series 5: T2 · axial · 4.0mm · 0.39mm/px · z∈[-66,+138]mm · 11 of 42 slices shown]
[im 1/42]
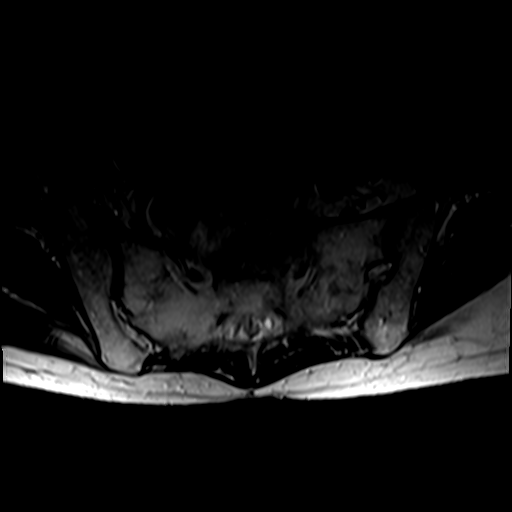
[im 5/42]
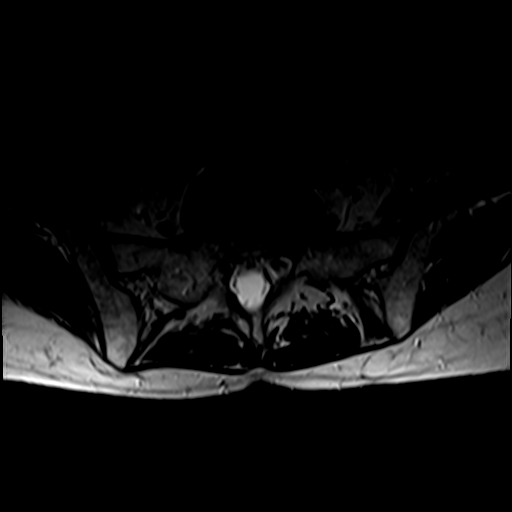
[im 9/42]
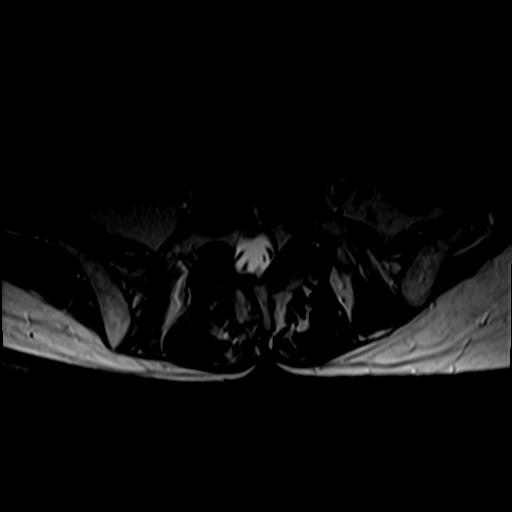
[im 13/42]
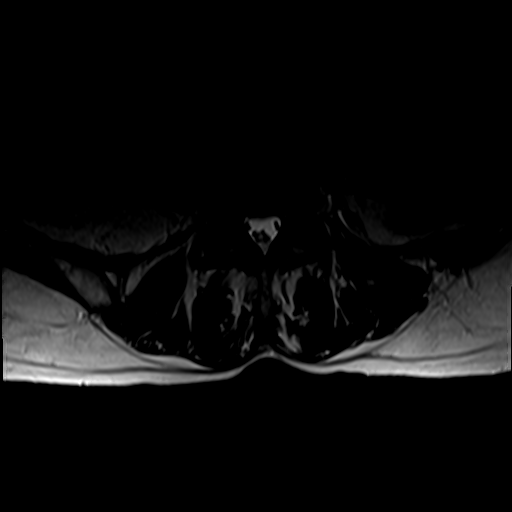
[im 17/42]
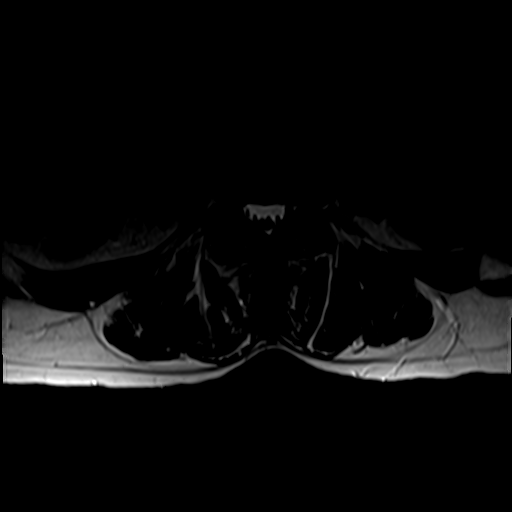
[im 21/42]
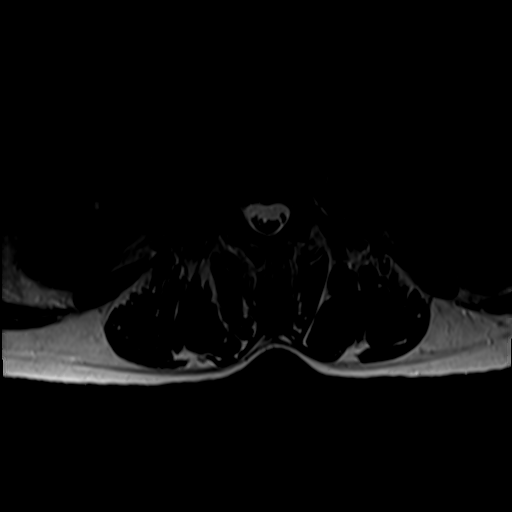
[im 25/42]
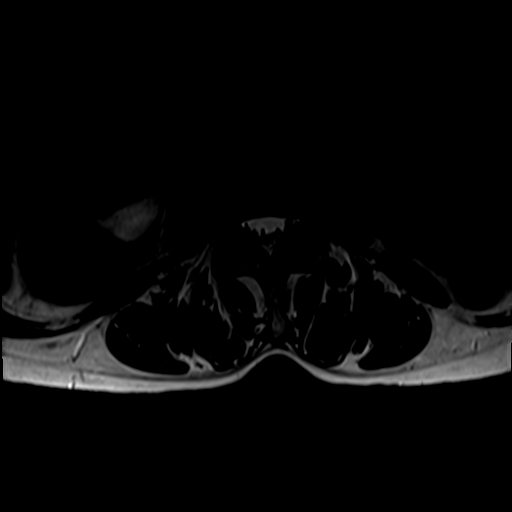
[im 29/42]
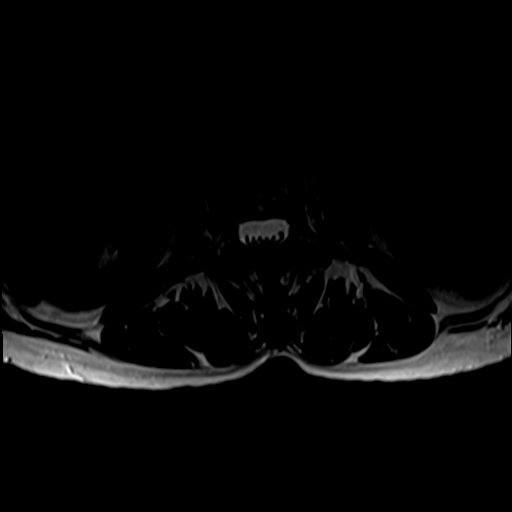
[im 33/42]
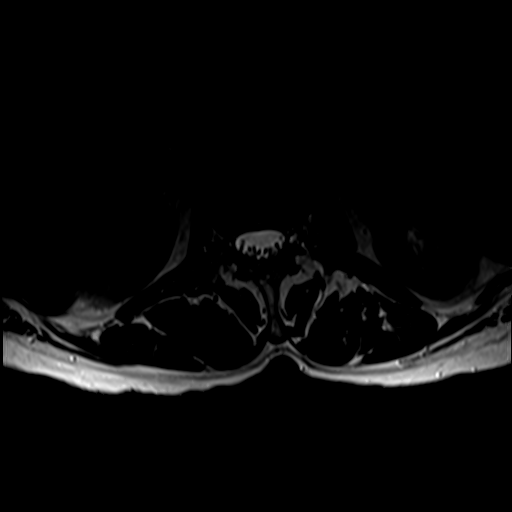
[im 37/42]
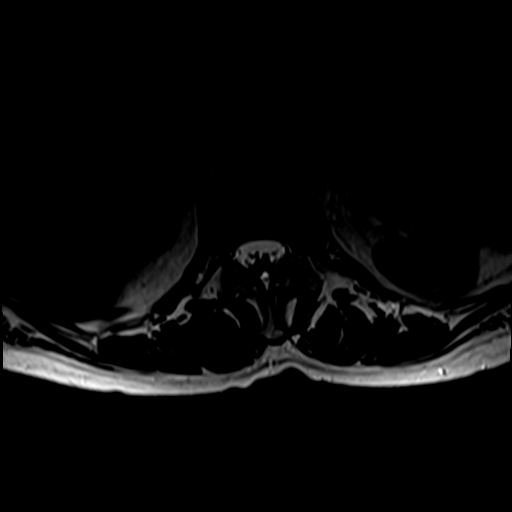
[im 42/42]
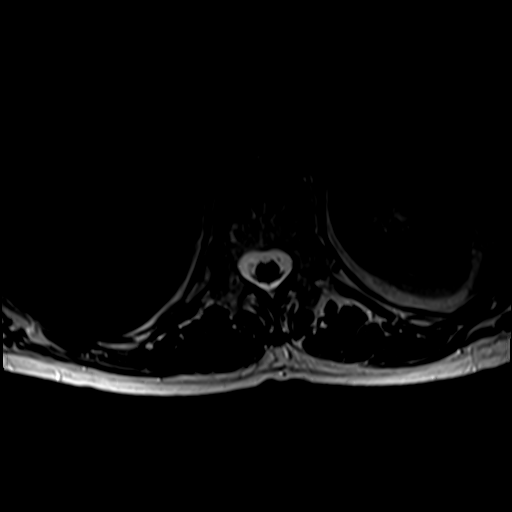

[Series 6: T1 · axial · 4.0mm · 0.39mm/px · z∈[-66,+114]mm · 4 of 42 slices shown (2 of 2)]
[im 1/42]
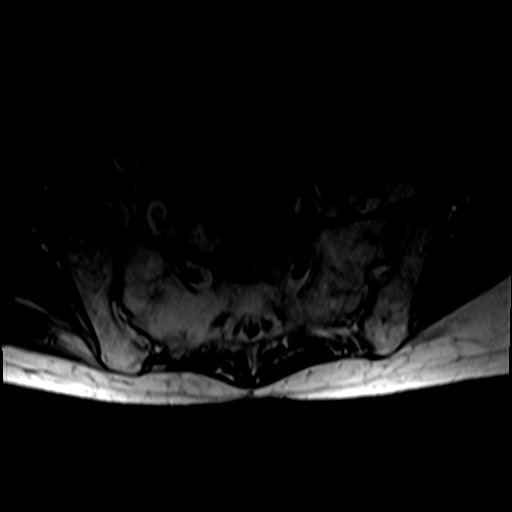
[im 5/42]
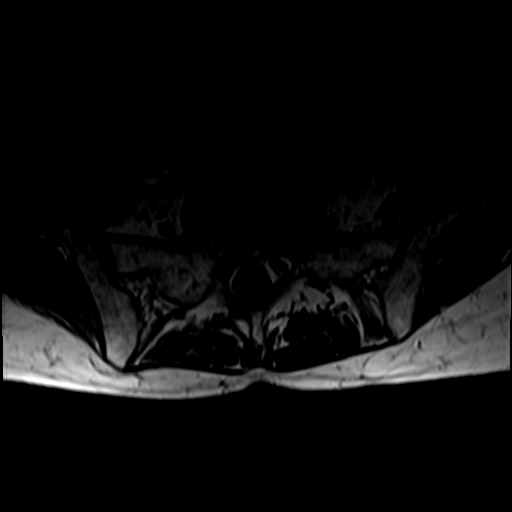
[im 21/42]
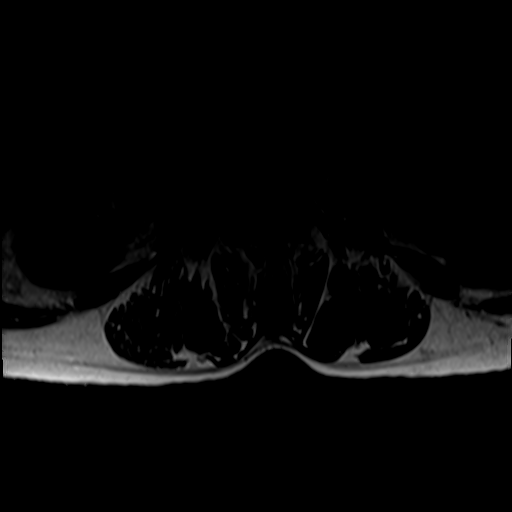
[im 37/42]
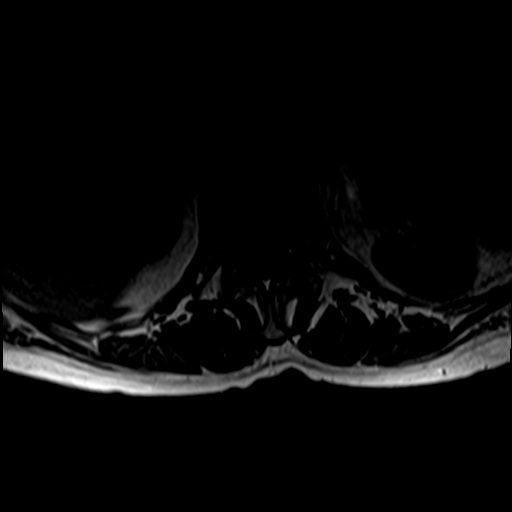

[Series 7: T2 post-contrast · sagittal · 4.0mm · 1.09mm/px · 4 of 15 slices shown]
[im 1/15]
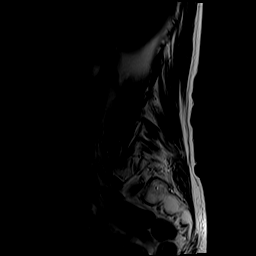
[im 5/15]
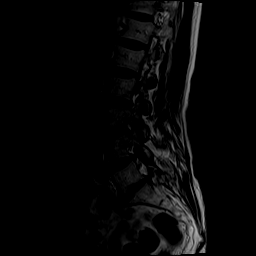
[im 10/15]
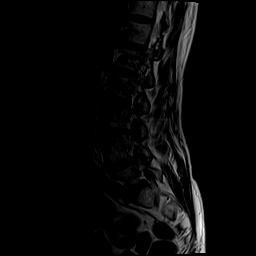
[im 15/15]
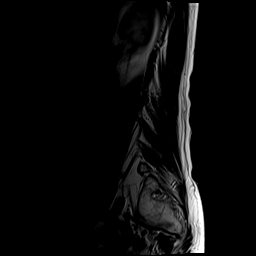

[22 of 48 positions shown; findings below may reference images not displayed]

FINDINGS: Segmentation: Standard. This is consistent with prior MRI numbering.

Alignment: No substantial sagittal subluxation. Mild
dextrocurvature.

Vertebrae: Vertebral body heights are maintained. No specific
evidence of acute fracture or discitis/osteomyelitis.
Degenerative/discogenic endplate signal changes about the L5-S1
disc. Mildly heterogeneous bone marrow without suspicious bone
lesion.

Conus medullaris and cauda equina: Conus extends to the L1 level.
Conus appears normal. No abnormal enhancement of the conus or cauda
equina.

Paraspinal and other soft tissues: Layering sludge with 2 cm stone
in the gallbladder with similar findings on the prior. The common
bile duct is mildly dilated (approximately 7 mm) which may be in
part chronic given some dilation on the prior. There is suggestion
of possible filling defect within the distal common bile duct
(series 5, image 17), although evaluation is limited on this study.

Disc levels:

T11-T12: Only imaged sagittally without evidence of significant
stenosis.

T12-L1: No significant disc protrusion, foraminal stenosis, or canal
stenosis.

L1-L2: No significant disc protrusion, foraminal stenosis, or canal
stenosis.

L2-L3: Mild disc bulging and facet hypertrophy without significant
canal or foraminal stenosis. No significant change.

L3-L4: Broad disc bulge, ligamentum flavum thickening, and mild
bilateral facet hypertrophy. Similar borderline bilateral
subarticular recess stenosis and mild bilateral foraminal stenosis.
No significant canal stenosis. No substantial change.

L4-L5: Mild progression of broad disc bulge with central annular
fissure. Ligamentum flavum thickening and mild to moderate bilateral
facet arthropathy. Resulting mild bilateral subarticular recess
stenosis and mild canal stenosis, progressed from prior.
Mild-to-moderate right and mild left foraminal stenosis, not
substantially changed.

L5-S1: Degenerative disc height loss with discogenic endplate signal
changes. Significantly decreased size of the previously seen right
subarticular disc protrusion small amount of residual right
subarticular disc which contacts the descending right S1 nerve roots
without clear impingement. No significant canal stenosis. Mild
bilateral foraminal stenosis, slightly progressed.
IMPRESSION: 1. At L5-S1, significantly decreased size of the previously seen
right subarticular disc protrusion. Small amount of residual disc in
the right subarticular recess which contacts the descending right S1
nerve roots without clear impingement. Mild bilateral foraminal
stenosis, slightly progressed.
2. At L4-L5, progression of now mild canal and bilateral
subarticular recess stenosis with similar mild to moderate right and
mild left foraminal stenosis.
3. At L3-L4, similar borderline bilateral subarticular recess
stenosis and mild bilateral foraminal stenosis.
4. Sludge with 2 cm stone in the gallbladder with similar findings
on the prior. The common bile duct is mildly dilated (approximately
7 mm) which may be in part chronic given some dilation on the prior.
There is suggestion of possible filling defect within the distal
common bile duct (series 5, image 17), although evaluation is
limited on this study. Correlation with liver enzymes may be
helpful. If there is clinical concern for obstruction then MRCP
could further evaluate.
